# Patient Record
Sex: Female | Born: 1960 | Race: Black or African American | Hispanic: No | Smoking: Current every day smoker
Health system: Southern US, Community
[De-identification: ages and names within clinical notes are randomized; demographics above are authoritative.]

## PROBLEM LIST (undated history)

## (undated) DIAGNOSIS — I2510 Atherosclerotic heart disease of native coronary artery without angina pectoris: Secondary | ICD-10-CM

## (undated) DIAGNOSIS — E079 Disorder of thyroid, unspecified: Secondary | ICD-10-CM

## (undated) DIAGNOSIS — I10 Essential (primary) hypertension: Secondary | ICD-10-CM

## (undated) DIAGNOSIS — E7800 Pure hypercholesterolemia, unspecified: Secondary | ICD-10-CM

## (undated) DIAGNOSIS — E119 Type 2 diabetes mellitus without complications: Secondary | ICD-10-CM

## (undated) HISTORY — PX: CORONARY ANGIOPLASTY: SHX604

## (undated) HISTORY — PX: ABDOMINAL HYSTERECTOMY: SHX81

## (undated) HISTORY — PX: CARDIAC CATHETERIZATION: SHX172

---

## 2016-01-10 ENCOUNTER — Encounter (HOSPITAL_COMMUNITY): Payer: Self-pay

## 2016-01-10 ENCOUNTER — Observation Stay (HOSPITAL_COMMUNITY)
Admission: EM | Admit: 2016-01-10 | Discharge: 2016-01-13 | Disposition: A | Discharge status: Home / Self Care | Payer: BLUE CROSS/BLUE SHIELD | Attending: Internal Medicine | Admitting: Internal Medicine

## 2016-01-10 ENCOUNTER — Emergency Department (HOSPITAL_COMMUNITY): Payer: BLUE CROSS/BLUE SHIELD

## 2016-01-10 DIAGNOSIS — D72829 Elevated white blood cell count, unspecified: Secondary | ICD-10-CM | POA: Insufficient documentation

## 2016-01-10 DIAGNOSIS — I2510 Atherosclerotic heart disease of native coronary artery without angina pectoris: Secondary | ICD-10-CM

## 2016-01-10 DIAGNOSIS — R0789 Other chest pain: Secondary | ICD-10-CM | POA: Diagnosis not present

## 2016-01-10 DIAGNOSIS — Z9641 Presence of insulin pump (external) (internal): Secondary | ICD-10-CM | POA: Diagnosis not present

## 2016-01-10 DIAGNOSIS — E785 Hyperlipidemia, unspecified: Secondary | ICD-10-CM | POA: Diagnosis not present

## 2016-01-10 DIAGNOSIS — E108 Type 1 diabetes mellitus with unspecified complications: Secondary | ICD-10-CM

## 2016-01-10 DIAGNOSIS — E1310 Other specified diabetes mellitus with ketoacidosis without coma: Secondary | ICD-10-CM

## 2016-01-10 DIAGNOSIS — I10 Essential (primary) hypertension: Secondary | ICD-10-CM | POA: Diagnosis present

## 2016-01-10 DIAGNOSIS — E039 Hypothyroidism, unspecified: Secondary | ICD-10-CM | POA: Insufficient documentation

## 2016-01-10 DIAGNOSIS — F17200 Nicotine dependence, unspecified, uncomplicated: Secondary | ICD-10-CM | POA: Diagnosis not present

## 2016-01-10 DIAGNOSIS — E1010 Type 1 diabetes mellitus with ketoacidosis without coma: Principal | ICD-10-CM

## 2016-01-10 DIAGNOSIS — N179 Acute kidney failure, unspecified: Secondary | ICD-10-CM

## 2016-01-10 DIAGNOSIS — Z794 Long term (current) use of insulin: Secondary | ICD-10-CM | POA: Insufficient documentation

## 2016-01-10 DIAGNOSIS — IMO0002 Reserved for concepts with insufficient information to code with codable children: Secondary | ICD-10-CM

## 2016-01-10 DIAGNOSIS — R079 Chest pain, unspecified: Secondary | ICD-10-CM | POA: Diagnosis present

## 2016-01-10 DIAGNOSIS — E1065 Type 1 diabetes mellitus with hyperglycemia: Secondary | ICD-10-CM

## 2016-01-10 DIAGNOSIS — Z885 Allergy status to narcotic agent status: Secondary | ICD-10-CM | POA: Diagnosis not present

## 2016-01-10 DIAGNOSIS — Z955 Presence of coronary angioplasty implant and graft: Secondary | ICD-10-CM | POA: Insufficient documentation

## 2016-01-10 DIAGNOSIS — F329 Major depressive disorder, single episode, unspecified: Secondary | ICD-10-CM | POA: Diagnosis not present

## 2016-01-10 DIAGNOSIS — I259 Chronic ischemic heart disease, unspecified: Secondary | ICD-10-CM

## 2016-01-10 DIAGNOSIS — I081 Rheumatic disorders of both mitral and tricuspid valves: Secondary | ICD-10-CM | POA: Insufficient documentation

## 2016-01-10 DIAGNOSIS — E7800 Pure hypercholesterolemia, unspecified: Secondary | ICD-10-CM | POA: Diagnosis not present

## 2016-01-10 DIAGNOSIS — Z7982 Long term (current) use of aspirin: Secondary | ICD-10-CM | POA: Insufficient documentation

## 2016-01-10 DIAGNOSIS — Z8249 Family history of ischemic heart disease and other diseases of the circulatory system: Secondary | ICD-10-CM | POA: Insufficient documentation

## 2016-01-10 DIAGNOSIS — E1110 Type 2 diabetes mellitus with ketoacidosis without coma: Secondary | ICD-10-CM | POA: Diagnosis present

## 2016-01-10 HISTORY — DX: Essential (primary) hypertension: I10

## 2016-01-10 HISTORY — DX: Atherosclerotic heart disease of native coronary artery without angina pectoris: I25.10

## 2016-01-10 HISTORY — DX: Type 2 diabetes mellitus without complications: E11.9

## 2016-01-10 HISTORY — DX: Pure hypercholesterolemia, unspecified: E78.00

## 2016-01-10 HISTORY — DX: Disorder of thyroid, unspecified: E07.9

## 2016-01-10 LAB — CBC
HEMATOCRIT: 43.6 % (ref 36.0–46.0)
Hemoglobin: 14.9 g/dL (ref 12.0–15.0)
MCH: 35.1 pg — ABNORMAL HIGH (ref 26.0–34.0)
MCHC: 34.2 g/dL (ref 30.0–36.0)
MCV: 102.6 fL — AB (ref 78.0–100.0)
PLATELETS: 158 10*3/uL (ref 150–400)
RBC: 4.25 MIL/uL (ref 3.87–5.11)
RDW: 13 % (ref 11.5–15.5)
WBC: 9.4 10*3/uL (ref 4.0–10.5)

## 2016-01-10 LAB — CBG MONITORING, ED: GLUCOSE-CAPILLARY: 532 mg/dL — AB (ref 65–99)

## 2016-01-10 LAB — I-STAT TROPONIN, ED: Troponin i, poc: 0 ng/mL (ref 0.00–0.08)

## 2016-01-10 MED ORDER — NITROGLYCERIN 0.4 MG SL SUBL
0.4000 mg | SUBLINGUAL_TABLET | SUBLINGUAL | Status: DC | PRN
  Administered 2016-01-11: 0.4 mg via SUBLINGUAL
  Filled 2016-01-10: qty 1

## 2016-01-10 MED ORDER — ASPIRIN 325 MG PO TABS
325.0000 mg | ORAL_TABLET | Freq: Once | ORAL | Status: AC
  Administered 2016-01-10: 325 mg via ORAL
  Filled 2016-01-10: qty 1

## 2016-01-10 MED ORDER — SODIUM CHLORIDE 0.9 % IV BOLUS (SEPSIS)
2000.0000 mL | Freq: Once | INTRAVENOUS | Status: AC
  Administered 2016-01-10: 2000 mL via INTRAVENOUS

## 2016-01-10 NOTE — ED Notes (Signed)
Patient transported to X-ray 

## 2016-01-10 NOTE — ED Provider Notes (Signed)
[facility]-EMERGENCY DEPT Provider Note   CSN: [id number] Arrival date & time: 01/10/16  2304 By signing my name below, I, Katelyn Richard, attest that this documentation has been prepared under the direction and in the presence of Tomasita Crumble, MD. Electronically Signed: Bridgette Richard, ED Scribe. 01/10/16. 11:42 PM.  History   Chief Complaint Chief Complaint  Patient presents with  . Hyperglycemia   The history is provided by the patient. No language interpreter was used.   HPI Comments: Katelyn Richard is a 56 y.o. female with h/o CAD, HTN, and DM on Novolog, who presents to the Emergency Department complaining of generalized weakness onset earlier today with associated nausea, chest pain, and vomiting (x2 episodes). Pt reports it also seems like she has an elevated blood sugar, noting that her CBG has been "undetectable" according to her monitor since this morning. Denies modifying factors. She has not tried any OTC medications PTA. Pt has two stents in place; she takes baby ASA daily. She has recently had a stress test which was unremarkable. Denies any recent illness. Pt further denies fever, shortness of breath, or any other associated symptoms.   Past Medical History:  Diagnosis Date  . Coronary artery disease   . Diabetes mellitus without complication (HCC)   . High cholesterol   . Hypertension   . Thyroid disease     There are no active problems to display for this patient.   History reviewed. No pertinent surgical history.  OB History    No data available       Home Medications    Prior to Admission medications   Not on File    Family History History reviewed. No pertinent family history.  Social History Social History  Substance Use Topics  . Smoking status: Current Every Day Smoker  . Smokeless tobacco: Never Used  . Alcohol use No     Allergies   Patient has no allergy information on record.   Review of Systems Review of Systems 10 Systems reviewed and all are  negative for acute change except as noted in the HPI. Physical Exam Updated Vital Signs BP 138/63 (BP Location: Left Arm)   Pulse 88   Temp 98.1 F (36.7 C) (Oral)   Resp 20   Ht 5\' 5"  (1.651 m)   Wt 142 lb (64.4 kg)   SpO2 100%   BMI 23.63 kg/m   Physical Exam  Constitutional: She is oriented to person, place, and time. She appears well-developed and well-nourished. No distress.  Insulin pump in place.  HENT:  Head: Normocephalic and atraumatic.  Nose: Nose normal.  Mouth/Throat: Oropharynx is clear and moist. No oropharyngeal exudate.  Eyes: Conjunctivae and EOM are normal. Pupils are equal, round, and reactive to light. No scleral icterus.  Neck: Normal range of motion. Neck supple. No JVD present. No tracheal deviation present. No thyromegaly present.  Cardiovascular: Normal rate, regular rhythm and normal heart sounds.  Exam reveals no gallop and no friction rub.   No murmur heard. Pulmonary/Chest: Effort normal and breath sounds normal. No respiratory distress. She has no wheezes. She exhibits no tenderness.  Abdominal: Soft. Bowel sounds are normal. She exhibits no distension and no mass. There is no tenderness. There is no rebound and no guarding.  Musculoskeletal: Normal range of motion. She exhibits no edema or tenderness.  Lymphadenopathy:    She has no cervical adenopathy.  Neurological: She is alert and oriented to person, place, and time. No cranial nerve deficit. She exhibits normal muscle  tone.  Skin: Skin is warm and dry. No rash noted. No erythema. No pallor.  Nursing note and vitals reviewed.    ED Treatments / Results  DIAGNOSTIC STUDIES: Oxygen Saturation is 100% on RA, normal by my interpretation.    COORDINATION OF CARE: 11:40 PM Discussed treatment plan with pt at bedside which includes IV fluids and pt agreed to plan.  Labs (all labs ordered are listed, but only abnormal results are displayed) Labs Reviewed  CBG MONITORING, ED - Abnormal; Notable  for the following:       Result Value   Glucose-Capillary 532 (*)    All other components within normal limits  BASIC METABOLIC PANEL  CBC  URINALYSIS, ROUTINE W REFLEX MICROSCOPIC  CBG MONITORING, ED    EKG  EKG Interpretation None       Radiology No results found.  Procedures Procedures (including critical care time)  Medications Ordered in ED Medications - No data to display   Initial Impression / Assessment and Plan / ED Course  I have reviewed the triage vital signs and the nursing notes.  Pertinent labs & imaging results that were available during my care of the patient were reviewed by me and considered in my medical decision making (see chart for details).  Clinical Course    Patient presents to the ED for chest pain that is concerning for ACS.  In addition, she has hyperglycemia which can be caused by ischemia.  There are no other causes of her high BS by her history.  She has an elevated AG of 17.  She was given 2L IVF and placed on insulin gtt.  Will consult with hospitalist for further care.  No acute ischemia on EKG, and initial troponin is negative.  She was given ASA and nitro.  CRITICAL CARE Performed by: Tomasita CrumbleNI,Friend Dorfman   Total critical care time: 40 minutes - DKA on insulin gtt  Critical care time was exclusive of separately billable procedures and treating other patients.  Critical care was necessary to treat or prevent imminent or life-threatening deterioration.  Critical care was time spent personally by me on the following activities: development of treatment plan with patient and/or surrogate as well as nursing, discussions with consultants, evaluation of patient's response to treatment, examination of patient, obtaining history from patient or surrogate, ordering and performing treatments and interventions, ordering and review of laboratory studies, ordering and review of radiographic studies, pulse oximetry and re-evaluation of patient's condition.       Final Clinical Impressions(s) / ED Diagnoses   Final diagnoses:  None    New Prescriptions New Prescriptions   No medications on file   I personally performed the services described in this documentation, which was scribed in my presence. The recorded information has been reviewed and is accurate.       Tomasita CrumbleAdeleke Alfonse Garringer, MD 01/11/16 [phone]

## 2016-01-10 NOTE — ED Triage Notes (Signed)
Pt is here for a meeting from out of town and her CBG has been undetectable according to her monitor since 10 am, pt is weak, talking slow and unsteady when she walks.

## 2016-01-11 ENCOUNTER — Observation Stay (HOSPITAL_BASED_OUTPATIENT_CLINIC_OR_DEPARTMENT_OTHER): Payer: BLUE CROSS/BLUE SHIELD

## 2016-01-11 ENCOUNTER — Encounter (HOSPITAL_COMMUNITY): Payer: Self-pay | Admitting: Internal Medicine

## 2016-01-11 DIAGNOSIS — E785 Hyperlipidemia, unspecified: Secondary | ICD-10-CM | POA: Diagnosis present

## 2016-01-11 DIAGNOSIS — R079 Chest pain, unspecified: Secondary | ICD-10-CM

## 2016-01-11 DIAGNOSIS — E1110 Type 2 diabetes mellitus with ketoacidosis without coma: Secondary | ICD-10-CM | POA: Diagnosis present

## 2016-01-11 DIAGNOSIS — I10 Essential (primary) hypertension: Secondary | ICD-10-CM | POA: Diagnosis not present

## 2016-01-11 DIAGNOSIS — E1010 Type 1 diabetes mellitus with ketoacidosis without coma: Secondary | ICD-10-CM

## 2016-01-11 DIAGNOSIS — R072 Precordial pain: Secondary | ICD-10-CM

## 2016-01-11 DIAGNOSIS — E1310 Other specified diabetes mellitus with ketoacidosis without coma: Secondary | ICD-10-CM | POA: Diagnosis not present

## 2016-01-11 DIAGNOSIS — N179 Acute kidney failure, unspecified: Secondary | ICD-10-CM

## 2016-01-11 DIAGNOSIS — E784 Other hyperlipidemia: Secondary | ICD-10-CM

## 2016-01-11 LAB — CBC
HCT: 37.7 % (ref 36.0–46.0)
Hemoglobin: 12.8 g/dL (ref 12.0–15.0)
MCH: 35.3 pg — ABNORMAL HIGH (ref 26.0–34.0)
MCHC: 34 g/dL (ref 30.0–36.0)
MCV: 103.9 fL — AB (ref 78.0–100.0)
PLATELETS: 135 10*3/uL — AB (ref 150–400)
RBC: 3.63 MIL/uL — ABNORMAL LOW (ref 3.87–5.11)
RDW: 12.7 % (ref 11.5–15.5)
WBC: 11.8 10*3/uL — AB (ref 4.0–10.5)

## 2016-01-11 LAB — URINALYSIS, ROUTINE W REFLEX MICROSCOPIC
BILIRUBIN URINE: NEGATIVE
Bacteria, UA: NONE SEEN
Hgb urine dipstick: NEGATIVE
KETONES UR: 80 mg/dL — AB
LEUKOCYTES UA: NEGATIVE
Nitrite: NEGATIVE
PH: 5 (ref 5.0–8.0)
PROTEIN: NEGATIVE mg/dL
Specific Gravity, Urine: 1.026 (ref 1.005–1.030)

## 2016-01-11 LAB — BASIC METABOLIC PANEL
ANION GAP: 17 — AB (ref 5–15)
Anion gap: 13 (ref 5–15)
Anion gap: 6 (ref 5–15)
BUN: 28 mg/dL — ABNORMAL HIGH (ref 6–20)
BUN: 26 mg/dL — AB (ref 6–20)
BUN: 21 mg/dL — AB (ref 6–20)
CHLORIDE: 96 mmol/L — AB (ref 101–111)
CO2: 20 mmol/L — AB (ref 22–32)
CO2: 15 mmol/L — ABNORMAL LOW (ref 22–32)
CO2: 24 mmol/L (ref 22–32)
CREATININE: 1.19 mg/dL — AB (ref 0.44–1.00)
CREATININE: 0.94 mg/dL (ref 0.44–1.00)
Calcium: 9.3 mg/dL (ref 8.9–10.3)
Calcium: 8 mg/dL — ABNORMAL LOW (ref 8.9–10.3)
Calcium: 8.1 mg/dL — ABNORMAL LOW (ref 8.9–10.3)
Chloride: 106 mmol/L (ref 101–111)
Chloride: 108 mmol/L (ref 101–111)
Creatinine, Ser: 1.32 mg/dL — ABNORMAL HIGH (ref 0.44–1.00)
GFR calc Af Amer: 51 mL/min — ABNORMAL LOW (ref >60)
GFR calc Af Amer: 58 mL/min — ABNORMAL LOW (ref >60)
GFR calc Af Amer: >60 mL/min (ref >60)
GFR calc non Af Amer: 50 mL/min — ABNORMAL LOW (ref >60)
GFR, EST NON AFRICAN AMERICAN: 44 mL/min — AB (ref >60)
GLUCOSE: 533 mg/dL — AB (ref 65–99)
GLUCOSE: 309 mg/dL — AB (ref 65–99)
GLUCOSE: 130 mg/dL — AB (ref 65–99)
POTASSIUM: 4.7 mmol/L (ref 3.5–5.1)
Potassium: 4.1 mmol/L (ref 3.5–5.1)
Potassium: 3.8 mmol/L (ref 3.5–5.1)
SODIUM: 134 mmol/L — AB (ref 135–145)
SODIUM: 138 mmol/L (ref 135–145)
Sodium: 133 mmol/L — ABNORMAL LOW (ref 135–145)

## 2016-01-11 LAB — TROPONIN I
Troponin I: <0.03 ng/mL (ref <0.03)
Troponin I: <0.03 ng/mL (ref <0.03)

## 2016-01-11 LAB — GLUCOSE, CAPILLARY
GLUCOSE-CAPILLARY: 116 mg/dL — AB (ref 65–99)
GLUCOSE-CAPILLARY: 116 mg/dL — AB (ref 65–99)
GLUCOSE-CAPILLARY: 135 mg/dL — AB (ref 65–99)
GLUCOSE-CAPILLARY: 279 mg/dL — AB (ref 65–99)
GLUCOSE-CAPILLARY: 215 mg/dL — AB (ref 65–99)
Glucose-Capillary: 179 mg/dL — ABNORMAL HIGH (ref 65–99)
Glucose-Capillary: 234 mg/dL — ABNORMAL HIGH (ref 65–99)

## 2016-01-11 LAB — MRSA PCR SCREENING: MRSA by PCR: NEGATIVE

## 2016-01-11 LAB — ECHOCARDIOGRAM COMPLETE
Height: 65 in
WEIGHTICAEL: 2278.67 [oz_av]

## 2016-01-11 LAB — CBG MONITORING, ED
GLUCOSE-CAPILLARY: 265 mg/dL — AB (ref 65–99)
GLUCOSE-CAPILLARY: 219 mg/dL — AB (ref 65–99)
Glucose-Capillary: 402 mg/dL — ABNORMAL HIGH (ref 65–99)
Glucose-Capillary: 475 mg/dL — ABNORMAL HIGH (ref 65–99)
Glucose-Capillary: 268 mg/dL — ABNORMAL HIGH (ref 65–99)
Glucose-Capillary: 158 mg/dL — ABNORMAL HIGH (ref 65–99)
Glucose-Capillary: 135 mg/dL — ABNORMAL HIGH (ref 65–99)

## 2016-01-11 MED ORDER — GABAPENTIN 300 MG PO CAPS
300.0000 mg | ORAL_CAPSULE | Freq: Two times a day (BID) | ORAL | Status: DC
  Administered 2016-01-11 – 2016-01-13 (×5): 300 mg via ORAL
  Filled 2016-01-11 (×5): qty 1

## 2016-01-11 MED ORDER — METOPROLOL TARTRATE 12.5 MG HALF TABLET
12.5000 mg | ORAL_TABLET | Freq: Two times a day (BID) | ORAL | Status: DC
  Administered 2016-01-11 – 2016-01-13 (×5): 12.5 mg via ORAL
  Filled 2016-01-11 (×5): qty 1

## 2016-01-11 MED ORDER — INSULIN ASPART 100 UNIT/ML ~~LOC~~ SOLN
0.0000 [IU] | Freq: Three times a day (TID) | SUBCUTANEOUS | Status: DC
Start: 2016-01-11 — End: 2016-01-12
  Administered 2016-01-11: 5 [IU] via SUBCUTANEOUS
  Administered 2016-01-12: 3 [IU] via SUBCUTANEOUS
  Administered 2016-01-12: 5 [IU] via SUBCUTANEOUS

## 2016-01-11 MED ORDER — VITAMIN D 1000 UNITS PO TABS
2000.0000 [IU] | ORAL_TABLET | Freq: Every day | ORAL | Status: DC
  Administered 2016-01-11 – 2016-01-13 (×3): 2000 [IU] via ORAL
  Filled 2016-01-11 (×4): qty 2

## 2016-01-11 MED ORDER — SODIUM CHLORIDE 0.9 % IV SOLN: INTRAVENOUS | Status: DC

## 2016-01-11 MED ORDER — SODIUM CHLORIDE 0.9 % IV SOLN
INTRAVENOUS | Status: DC
  Administered 2016-01-11: 3.4 [IU]/h via INTRAVENOUS
  Filled 2016-01-11: qty 2.5

## 2016-01-11 MED ORDER — ENOXAPARIN SODIUM 40 MG/0.4ML ~~LOC~~ SOLN
40.0000 mg | SUBCUTANEOUS | Status: DC
  Administered 2016-01-11 – 2016-01-13 (×3): 40 mg via SUBCUTANEOUS
  Filled 2016-01-11 (×4): qty 0.4

## 2016-01-11 MED ORDER — INSULIN ASPART 100 UNIT/ML ~~LOC~~ SOLN
4.0000 [IU] | Freq: Three times a day (TID) | SUBCUTANEOUS | Status: DC
  Administered 2016-01-11 – 2016-01-12 (×3): 4 [IU] via SUBCUTANEOUS

## 2016-01-11 MED ORDER — DEXTROSE-NACL 5-0.45 % IV SOLN
INTRAVENOUS | Status: DC
Start: 2016-01-11 — End: 2016-01-11
  Administered 2016-01-11: 06:00:00 via INTRAVENOUS

## 2016-01-11 MED ORDER — SODIUM CHLORIDE 0.9 % IV SOLN
INTRAVENOUS | Status: DC
  Administered 2016-01-11: 4.2 [IU]/h via INTRAVENOUS
  Filled 2016-01-11: qty 2.5

## 2016-01-11 MED ORDER — POTASSIUM CHLORIDE IN NACL 20-0.9 MEQ/L-% IV SOLN
INTRAVENOUS | Status: DC
  Administered 2016-01-11 – 2016-01-13 (×6): via INTRAVENOUS
  Filled 2016-01-11 (×8): qty 1000

## 2016-01-11 MED ORDER — LEVOTHYROXINE SODIUM 75 MCG PO TABS
150.0000 ug | ORAL_TABLET | Freq: Every day | ORAL | Status: DC
  Administered 2016-01-11 – 2016-01-13 (×3): 150 ug via ORAL
  Filled 2016-01-11: qty 2
  Filled 2016-01-11: qty 1
  Filled 2016-01-11 (×2): qty 2

## 2016-01-11 MED ORDER — SODIUM CHLORIDE 0.9 % IV SOLN
INTRAVENOUS | Status: DC
  Administered 2016-01-11: 03:00:00 via INTRAVENOUS

## 2016-01-11 MED ORDER — ATORVASTATIN CALCIUM 40 MG PO TABS
80.0000 mg | ORAL_TABLET | Freq: Every day | ORAL | Status: DC
  Administered 2016-01-11 – 2016-01-13 (×3): 80 mg via ORAL
  Filled 2016-01-11: qty 1
  Filled 2016-01-11 (×3): qty 2

## 2016-01-11 MED ORDER — AMLODIPINE BESYLATE 10 MG PO TABS: 10.0000 mg | ORAL_TABLET | Freq: Every day | ORAL | Status: DC

## 2016-01-11 MED ORDER — INSULIN ASPART 100 UNIT/ML ~~LOC~~ SOLN
0.0000 [IU] | Freq: Every day | SUBCUTANEOUS | Status: DC
Start: 2016-01-11 — End: 2016-01-13
  Administered 2016-01-11: 2 [IU] via SUBCUTANEOUS

## 2016-01-11 MED ORDER — BUPROPION HCL ER (SR) 150 MG PO TB12
150.0000 mg | ORAL_TABLET | Freq: Two times a day (BID) | ORAL | Status: DC
  Administered 2016-01-11 – 2016-01-13 (×5): 150 mg via ORAL
  Filled 2016-01-11 (×5): qty 1

## 2016-01-11 MED ORDER — INSULIN GLARGINE 100 UNIT/ML ~~LOC~~ SOLN
18.0000 [IU] | Freq: Every day | SUBCUTANEOUS | Status: DC
  Administered 2016-01-11 – 2016-01-12 (×2): 18 [IU] via SUBCUTANEOUS
  Filled 2016-01-11 (×2): qty 0.18

## 2016-01-11 MED ORDER — DEXTROSE-NACL 5-0.45 % IV SOLN: INTRAVENOUS | Status: DC

## 2016-01-11 NOTE — Progress Notes (Addendum)
PROGRESS NOTE  Katelyn Richard ZOX:[id number] DOB: May 30, 1960 DOA: 01/10/2016 PCP: [facility]  Brief History:  56 year old female with a history of coronary artery disease, diabetes mellitus, hypertension, hyperlipidemia presents with 2-3 day history of generalized weakness, nausea, vomiting, abdominal pain. The patient denies any recent illnesses. She denied any fevers, chills, shortness breath, coughing, sore throat, diarrhea, hematochezia, melena. Workup initially revealed serum glucose 533 with anion gap of 17. The patient was started on IV fluids and IV insulin and treated for DKA.  Assessment/Plan: DKA in type I diabetic --patient started on IV insulin with q 1 hour CBG check and q 4 hour BMPs -pt started on aggressive fluid resuscitation -Electrolytes were monitored and repleted -transitioned Lantus--concerned pump not working properly as pt developed DKA without antecedent illness with pump on -diet was advanced once anion gap closed -HbA1C-- -Urinalysis negative for pyuria -Personally reviewed Chest x-ray-- negative for acute findings  -Type 1 diabetes mellitus -Patient has not followed up with endocrinologist in 6 months -She rotates her insulin pump every 3 days -Basal rate 0.800 units per hour; insulin sensitivity 10:1 -Hemoglobin A1c  Atypical chest pain with hx of CAD -Finished cycle troponins -Troponins negative 2 -Personally reviewed EKG-- negative for concerning ischemic changes -12/19/2015 Myoview--normal with EF 69% -continue ASA  AKI -Baseline creatinine 0.8-0.9 -Presented creatinine 1.32 -Secondary to volume depletion -improved  Hypertension -Holding amlodipine secondary to soft blood pressure -Continue reduced dose metoprolol tartrate  Hyperlipidemia -Continue statin  Depression  -continue Wellbutrin  Hypothyroidism -Continue Synthroid  Leukocytosis -Likely stress demargination -Patient is afebrile and hemodynamically  stable -Monitor clinically -Urinalysis and chest x-ray negative      Disposition Plan:   Home 01/12/16 if stable Family Communication:   No Family at bedside--Total time spent 35 minutes.  Greater than 50% spent face to face counseling and coordinating care.   Consultants:  none  Code Status:  FULL   DVT Prophylaxis:  [place] Lovenox   Procedures: As Listed in Progress Note Above  Antibiotics: None    Subjective Patient denies fevers, chills, headache, chest pain, dyspnea, nausea, vomiting, diarrhea, abdominal pain, dysuria, hematuria, hematochezia, and melena.    Objective: Vitals:   01/11/16 0654 01/11/16 0700 01/11/16 0800 01/11/16 0900  BP:  123/66 (!) 116/46   Pulse:  89    Resp:  25 17 12   Temp: 98.1 F (36.7 C)  98.1 F (36.7 C)   TempSrc: Oral  Oral   SpO2:  98% 98%   Weight:   64.6 kg (142 lb 6.7 oz)   Height:   5\' 5"  (1.651 m)     Intake/Output Summary (Last 24 hours) at 01/11/16 0951 Last data filed at 01/11/16 0900  Gross per 24 hour  Intake          2352.12 ml  Output                0 ml  Net          2352.12 ml   Weight change:  Exam:   General:  Pt is alert, follows commands appropriately, not in acute distress  HEENT: No icterus, No thrush, No neck mass, [place]/AT  Cardiovascular: RRR, S1/S2, no rubs, no gallops  Respiratory: CTA bilaterally, no wheezing, no crackles, no rhonchi  Abdomen: Soft/+BS, non tender, non distended, no guarding  Extremities: No edema, No lymphangitis, No petechiae, No rashes, no synovitis   Data Reviewed: I have personally reviewed following  labs and imaging studies Basic Metabolic Panel:  Recent Labs Lab 01/10/16 2326 01/11/16 0332 01/11/16 0828  NA 133* 134* 138  K 4.7 4.1 3.8  CL 96* 106 108  CO2 20* 15* 24  GLUCOSE 533* 309* 130*  BUN 28* 26* 21*  CREATININE 1.32* 1.19* 0.94  CALCIUM 9.3 8.0* 8.1*   Liver Function Tests: No results for input(s): AST, ALT, ALKPHOS, BILITOT, PROT, ALBUMIN in  the last 168 hours. No results for input(s): LIPASE, AMYLASE in the last 168 hours. No results for input(s): AMMONIA in the last 168 hours. Coagulation Profile: No results for input(s): INR, PROTIME in the last 168 hours. CBC:  Recent Labs Lab 01/10/16 2326 01/11/16 0332  WBC 9.4 11.8*  HGB 14.9 12.8  HCT 43.6 37.7  MCV 102.6* 103.9*  PLT 158 135*   Cardiac Enzymes:  Recent Labs Lab 01/11/16 0332 01/11/16 0828  TROPONINI <0.03 <0.03   BNP: Invalid input(s): POCBNP CBG:  Recent Labs Lab 01/11/16 0451 01/11/16 0548 01/11/16 0633 01/11/16 0736 01/11/16 0857  GLUCAP 265* 219* 158* 135* 116*   HbA1C: No results for input(s): HGBA1C in the last 72 hours. Urine analysis:    Component Value Date/Time   COLORURINE STRAW (A) 01/10/2016 0028   APPEARANCEUR CLEAR 01/10/2016 0028   LABSPEC 1.026 01/10/2016 0028   PHURINE 5.0 01/10/2016 0028   GLUCOSEU >=500 (A) 01/10/2016 0028   HGBUR NEGATIVE 01/10/2016 0028   BILIRUBINUR NEGATIVE 01/10/2016 0028   KETONESUR 80 (A) 01/10/2016 0028   PROTEINUR NEGATIVE 01/10/2016 0028   NITRITE NEGATIVE 01/10/2016 0028   LEUKOCYTESUR NEGATIVE 01/10/2016 0028   Sepsis Labs: @LABRCNTIP (procalcitonin:4,lacticidven:4) )No results found for this or any previous visit (from the past 240 hour(s)).   Scheduled Meds: . atorvastatin  80 mg Oral Daily  . buPROPion  150 mg Oral BID  . cholecalciferol  2,000 Units Oral Daily  . enoxaparin (LOVENOX) injection  40 mg Subcutaneous Q24H  . gabapentin  300 mg Oral BID  . levothyroxine  150 mcg Oral QAC breakfast  . metoprolol tartrate  12.5 mg Oral BID   Continuous Infusions: . sodium chloride Stopped (01/11/16 0547)  . dextrose 5 % and 0.45% NaCl 125 mL/hr at 01/11/16 0613  . insulin (NOVOLIN-R) infusion 3.4 Units/hr (01/11/16 0241)    Procedures/Studies: Dg Chest 2 View  Result Date: 01/10/2016 CLINICAL DATA:  Chest pain. History of hypertension and diabetes. Sudden onset shortness of  breath, weakness, dizziness, and chest pain today. Current smoker. EXAM: CHEST  2 VIEW COMPARISON:  None. FINDINGS: Normal heart size and pulmonary vascularity. No focal airspace disease or consolidation in the lungs. No blunting of costophrenic angles. No pneumothorax. Mediastinal contours appear intact. Coronary artery stents. IMPRESSION: No active cardiopulmonary disease. Electronically Signed   By: Burman Nieves M.D.   On: 01/10/2016 23:58    Loletta Harper, DO  [facility] Pager [phone]  If 7PM-7AM, please contact night-coverage [email or web address] Password [email or web address] 01/11/2016, 9:51 AM   LOS: 0 days

## 2016-01-11 NOTE — H&P (Signed)
History and Physical    Katelyn Richard XBJ:[id number] DOB: [birth year] DOA: 01/10/2016  PCP: [facility]  Patient coming from: Home.  Chief Complaint: Elevated blood sugar and chest pain.  HPI: Katelyn Richard is a 56 y.o. female with history of diabetes mellitus type 1, CAD status post stenting, hypothyroidism, hypertension presents to the ER because of increasing blood sugar over the last 3 days and started developing nausea vomiting followed by chest pain yesterday. Patient is in [place] for family meeting. Patient usually gets her [facility] at [facility]. Patient uses insulin pump and states that she has been recently stressed out. Denies having not missed any of her medications or not compliant with her diet. In the ER patient is found to have elevated blood sugar with anion gap. Patient also has been having chest pain off and on mostly retrosternal increasing on exertion. EKG and cardiac markers and chest x-ray were unremarkable. Patient is being admitted for diabetic ketoacidosis and chest pain. Patient did have at least 2-3 episodes of nausea vomiting prior to coming. Abdomen appears benign.   ED Course: Patient was started on IV fluids and IV insulin infusion. Aspirin for chest pain.  Review of Systems: As per HPI, rest all negative.   Past Medical History:  Diagnosis Date  . Coronary artery disease   . Diabetes mellitus without complication (HCC)   . High cholesterol   . Hypertension   . Thyroid disease     Past Surgical History:  Procedure Laterality Date  . ABDOMINAL HYSTERECTOMY    . CARDIAC CATHETERIZATION    . CORONARY ANGIOPLASTY       reports that she has been smoking.  She has never used smokeless tobacco. She reports that she does not drink alcohol. Her drug history is not on file.  Allergies  Allergen Reactions  . Morphine Anaphylaxis and Swelling    Family History  Problem Relation Age of Onset  . Diabetes Mellitus II Mother     . Hypertension Mother   . Hypertension Father   . CAD Neg Hx     Prior to Admission medications   Medication Sig Start Date End Date Taking? Authorizing Provider  amLODipine (NORVASC) 10 MG tablet Take 10 mg by mouth daily.   Yes Historical Provider, MD  atorvastatin (LIPITOR) 80 MG tablet Take 80 mg by mouth daily.   Yes Historical Provider, MD  buPROPion (WELLBUTRIN SR) 150 MG 12 hr tablet Take 150 mg by mouth 2 (two) times daily.   Yes Historical Provider, MD  cholecalciferol (VITAMIN D) 1000 units tablet Take 2,000 Units by mouth daily.   Yes Historical Provider, MD  diclofenac sodium (VOLTAREN) 1 % GEL Apply 2 g topically 4 (four) times daily.   Yes Historical Provider, MD  gabapentin (NEURONTIN) 100 MG capsule Take 300 mg by mouth 2 (two) times daily.   Yes Historical Provider, MD  levothyroxine (SYNTHROID, LEVOTHROID) 50 MCG tablet Take 150 mcg by mouth daily before breakfast.   Yes Historical Provider, MD  metoprolol tartrate (LOPRESSOR) 25 MG tablet Take 12.5 mg by mouth 2 (two) times daily.   Yes Historical Provider, MD  NOVOLOG 100 UNIT/ML injection 0.8 Units by Continuous infusion (non-IV) route every hour. Insulin pump 10/12/15  Yes Historical Provider, MD    Physical Exam: Vitals:   01/11/16 0115 01/11/16 0130 01/11/16 0145 01/11/16 0200  BP:  130/62  133/57  Pulse: 95 97 100 99  Resp: 16 20 22 20   Temp:  TempSrc:      SpO2: 99% 99% 97% 97%  Weight:      Height:          Constitutional: Moderately built and nourished. Vitals:   01/11/16 0115 01/11/16 0130 01/11/16 0145 01/11/16 0200  BP:  130/62  133/57  Pulse: 95 97 100 99  Resp: 16 20 22 20   Temp:      TempSrc:      SpO2: 99% 99% 97% 97%  Weight:      Height:       Eyes: Anicteric. No pallor. ENMT: No discharge from the ears eyes nose and mouth. Neck: No mass felt. No neck rigidity. Respiratory: No rhonchi or crepitations. Cardiovascular: S1 and S2. No murmurs appreciated. Abdomen: Soft  nontender bowel sounds present. No guarding or rigidity. Musculoskeletal: No edema. No joint effusion. Skin: No rash. Skin appears warm. Neurologic: Alert awake oriented to time place and person. Moves all extremities. Psychiatric: Appears normal. Normal affect.   Labs on Admission: I have personally reviewed following labs and imaging studies  CBC:  Recent Labs Lab 01/10/16 2326  WBC 9.4  HGB 14.9  HCT 43.6  MCV 102.6*  PLT 158   Basic Metabolic Panel:  Recent Labs Lab 01/10/16 2326  NA 133*  K 4.7  CL 96*  CO2 20*  GLUCOSE 533*  BUN 28*  CREATININE 1.32*  CALCIUM 9.3   GFR: Estimated Creatinine Clearance: 42.8 mL/min (by C-G formula based on SCr of 1.32 mg/dL (H)). Liver Function Tests: No results for input(s): AST, ALT, ALKPHOS, BILITOT, PROT, ALBUMIN in the last 168 hours. No results for input(s): LIPASE, AMYLASE in the last 168 hours. No results for input(s): AMMONIA in the last 168 hours. Coagulation Profile: No results for input(s): INR, PROTIME in the last 168 hours. Cardiac Enzymes: No results for input(s): CKTOTAL, CKMB, CKMBINDEX, TROPONINI in the last 168 hours. BNP (last 3 results) No results for input(s): PROBNP in the last 8760 hours. HbA1C: No results for input(s): HGBA1C in the last 72 hours. CBG:  Recent Labs Lab 01/10/16 2314 01/11/16 0136 01/11/16 0223  GLUCAP 532* 475* 402*   Lipid Profile: No results for input(s): CHOL, HDL, LDLCALC, TRIG, CHOLHDL, LDLDIRECT in the last 72 hours. Thyroid Function Tests: No results for input(s): TSH, T4TOTAL, FREET4, T3FREE, THYROIDAB in the last 72 hours. Anemia Panel: No results for input(s): VITAMINB12, FOLATE, FERRITIN, TIBC, IRON, RETICCTPCT in the last 72 hours. Urine analysis:    Component Value Date/Time   COLORURINE STRAW (A) 01/10/2016 0028   APPEARANCEUR CLEAR 01/10/2016 0028   LABSPEC 1.026 01/10/2016 0028   PHURINE 5.0 01/10/2016 0028   GLUCOSEU >=500 (A) 01/10/2016 0028   HGBUR  NEGATIVE 01/10/2016 0028   BILIRUBINUR NEGATIVE 01/10/2016 0028   KETONESUR 80 (A) 01/10/2016 0028   PROTEINUR NEGATIVE 01/10/2016 0028   NITRITE NEGATIVE 01/10/2016 0028   LEUKOCYTESUR NEGATIVE 01/10/2016 0028   Sepsis Labs: @LABRCNTIP (procalcitonin:4,lacticidven:4) )No results found for this or any previous visit (from the past 240 hour(s)).   Radiological Exams on Admission: Dg Chest 2 View  Result Date: 01/10/2016 CLINICAL DATA:  Chest pain. History of hypertension and diabetes. Sudden onset shortness of breath, weakness, dizziness, and chest pain today. Current smoker. EXAM: CHEST  2 VIEW COMPARISON:  None. FINDINGS: Normal heart size and pulmonary vascularity. No focal airspace disease or consolidation in the lungs. No blunting of costophrenic angles. No pneumothorax. Mediastinal contours appear intact. Coronary artery stents. IMPRESSION: No active cardiopulmonary disease. Electronically Signed   By:  Burman Nieves M.D.   On: 01/10/2016 23:58    EKG: Independently reviewed. Normal sinus rhythm.  Assessment/Plan Principal Problem:   Diabetic acidosis without coma (HCC) Active Problems:   Chest pain   Hypertension   HLD (hyperlipidemia)    1. Diabetic ketoacidosis in type 1 diabetes - not sure what was the precipitating cause. Patient has been placed on IV insulin infusion along with fluids. Follow metabolic panel closely. Once anion gap gets corrected place patient on back on her insulin pump on Lantus. 2. Chest pain with history of CAD status post stenting - continue aspirin beta blocker and statin. Patient has had a stress test last month, which was unremarkable. Check 2-D echo. 3. Nausea vomiting - abdomen appears benign. Follow clinically. 4. Hypertension - continue present medications. 5. Hypothyroidism - continue present medications.   DVT prophylaxis: Lovenox. Code Status: Full code.  Family Communication: Discussed with patient.  Disposition Plan: Home.  Consults  called: None.  Admission status: Observation.    Eduard Clos MD [facility] Pager [phone].  If 7PM-7AM, please contact night-coverage [email or web address] Password [email or web address]  01/11/2016, 2:35 AM

## 2016-01-11 NOTE — Progress Notes (Signed)
Pt . transferred to 1523 by wheel chair. Belongings sent with patient. Hand off report given to Peace, Charity fundraiserN.

## 2016-01-11 NOTE — Progress Notes (Signed)
  Echocardiogram 2D Echocardiogram has been performed.  Nolon RodBrown, Tony 01/11/2016, 3:14 PM

## 2016-01-11 NOTE — ED Notes (Signed)
Hospitalist at bedside 

## 2016-01-11 NOTE — ED Notes (Signed)
CBG 268 insulin gtt decreased to 2.1 units per hour pt sleeping VS stable

## 2016-01-11 NOTE — ED Notes (Signed)
Report called to ICU Rn. Patient taken to the room with RN on the monitor.

## 2016-01-11 NOTE — Progress Notes (Signed)
Inpatient Diabetes Program Recommendations  AACE/ADA: New Consensus Statement on Inpatient Glycemic Control (2015)  Target Ranges:  Prepandial:   less than 140 mg/dL      Peak postprandial:   less than 180 mg/dL (1-2 hours)      Critically ill patients:  140 - 180 mg/dL   Lab Results  Component Value Date   GLUCAP 135 (H) 01/11/2016    Review of Glycemic Control  Diabetes history: DM1 Outpatient Diabetes medications: Insulin pump Current orders for Inpatient glycemic control: IV insulin per GlucoStabilizer  56 year old female presents to ED with CP and hyperglycemia. Found to be in DKA. Had insulin pump. Started IV insulin drip and ready for transition. Spoke to pt and instructed her to call pump company to do a check since she came in with DKA. Pt has Lantus and Novolog at home and is willing to transition to SQ insulin rather than pump. Pt to call her endocrinologist regarding her pump, along with the pump company.  Discussed with MD and RN. Pump setting:Basal - 0.8 units/hr = 19.2 units 1:10 CHO ratio  Inpatient Diabetes Program Recommendations:    Lantus 18 units Q24H - give 1-2 hours prior to discontinuation of drip Novolog 4 units tidwc for meal coverage insulin if pt eats > 50% meal Novolog sensitive tidwc and hs.  Will continue to follow while inpatient.  Thank you. Ailene Ardshonda Truett Mcfarlan, RD, LDN, CDE Inpatient Diabetes Coordinator [phone]

## 2016-01-12 DIAGNOSIS — E1310 Other specified diabetes mellitus with ketoacidosis without coma: Secondary | ICD-10-CM

## 2016-01-12 DIAGNOSIS — E1065 Type 1 diabetes mellitus with hyperglycemia: Secondary | ICD-10-CM

## 2016-01-12 DIAGNOSIS — E108 Type 1 diabetes mellitus with unspecified complications: Secondary | ICD-10-CM

## 2016-01-12 DIAGNOSIS — I10 Essential (primary) hypertension: Secondary | ICD-10-CM | POA: Diagnosis not present

## 2016-01-12 DIAGNOSIS — E1010 Type 1 diabetes mellitus with ketoacidosis without coma: Secondary | ICD-10-CM | POA: Diagnosis not present

## 2016-01-12 DIAGNOSIS — I2510 Atherosclerotic heart disease of native coronary artery without angina pectoris: Secondary | ICD-10-CM | POA: Diagnosis not present

## 2016-01-12 LAB — CBC
HEMATOCRIT: 34.7 % — AB (ref 36.0–46.0)
HEMOGLOBIN: 11.6 g/dL — AB (ref 12.0–15.0)
MCH: 34.5 pg — ABNORMAL HIGH (ref 26.0–34.0)
MCHC: 33.4 g/dL (ref 30.0–36.0)
MCV: 103.3 fL — ABNORMAL HIGH (ref 78.0–100.0)
Platelets: 114 10*3/uL — ABNORMAL LOW (ref 150–400)
RBC: 3.36 MIL/uL — ABNORMAL LOW (ref 3.87–5.11)
RDW: 13.3 % (ref 11.5–15.5)
WBC: 4.1 10*3/uL (ref 4.0–10.5)

## 2016-01-12 LAB — BASIC METABOLIC PANEL
ANION GAP: 4 — AB (ref 5–15)
BUN: 20 mg/dL (ref 6–20)
CHLORIDE: 111 mmol/L (ref 101–111)
CO2: 23 mmol/L (ref 22–32)
Calcium: 7.8 mg/dL — ABNORMAL LOW (ref 8.9–10.3)
Creatinine, Ser: 1.06 mg/dL — ABNORMAL HIGH (ref 0.44–1.00)
GFR calc Af Amer: >60 mL/min (ref >60)
GFR, EST NON AFRICAN AMERICAN: 58 mL/min — AB (ref >60)
GLUCOSE: 251 mg/dL — AB (ref 65–99)
POTASSIUM: 4.5 mmol/L (ref 3.5–5.1)
Sodium: 138 mmol/L (ref 135–145)

## 2016-01-12 LAB — LIPID PANEL
CHOLESTEROL: 177 mg/dL (ref 0–200)
HDL: 73 mg/dL (ref >40)
LDL CALC: 87 mg/dL (ref 0–99)
Total CHOL/HDL Ratio: 2.4 RATIO
Triglycerides: 84 mg/dL (ref <150)
VLDL: 17 mg/dL (ref 0–40)

## 2016-01-12 LAB — GLUCOSE, CAPILLARY
GLUCOSE-CAPILLARY: 211 mg/dL — AB (ref 65–99)
GLUCOSE-CAPILLARY: 252 mg/dL — AB (ref 65–99)
GLUCOSE-CAPILLARY: 153 mg/dL — AB (ref 65–99)
Glucose-Capillary: 255 mg/dL — ABNORMAL HIGH (ref 65–99)

## 2016-01-12 LAB — HEMOGLOBIN A1C
HEMOGLOBIN A1C: 9 % — AB (ref 4.8–5.6)
MEAN PLASMA GLUCOSE: 212 mg/dL

## 2016-01-12 MED ORDER — INSULIN GLARGINE 100 UNIT/ML ~~LOC~~ SOLN
22.0000 [IU] | Freq: Every day | SUBCUTANEOUS | Status: DC
  Administered 2016-01-13: 22 [IU] via SUBCUTANEOUS
  Filled 2016-01-12: qty 0.22

## 2016-01-12 MED ORDER — INSULIN ASPART 100 UNIT/ML ~~LOC~~ SOLN
0.0000 [IU] | SUBCUTANEOUS | Status: DC
  Administered 2016-01-12: 5 [IU] via SUBCUTANEOUS
  Administered 2016-01-12: 8 [IU] via SUBCUTANEOUS
  Administered 2016-01-13: 3 [IU] via SUBCUTANEOUS

## 2016-01-12 MED ORDER — INSULIN ASPART 100 UNIT/ML ~~LOC~~ SOLN
6.0000 [IU] | Freq: Three times a day (TID) | SUBCUTANEOUS | Status: DC
  Administered 2016-01-12 – 2016-01-13 (×3): 6 [IU] via SUBCUTANEOUS

## 2016-01-12 NOTE — Progress Notes (Signed)
Inpatient Diabetes Program Recommendations  AACE/ADA: New Consensus Statement on Inpatient Glycemic Control (2015)  Target Ranges:  Prepandial:   less than 140 mg/dL      Peak postprandial:   less than 180 mg/dL (1-2 hours)      Critically ill patients:  140 - 180 mg/dL   Lab Results  Component Value Date   GLUCAP 255 (H) 01/12/2016   HGBA1C 9.0 (H) 01/11/2016    Review of Glycemic Control  Blood sugars > 180 mg/dL.  Inpatient Diabetes Program Recommendations:    Increase Lantus to 20 units QD Increase Novolog to 6 units tidwc. To start insulin pump when pt sees her endo and has pump checked by pump company.  Continue to follow.  Thank you. Ailene Ardshonda Marcee Jacobs, RD, LDN, CDE Inpatient Diabetes Coordinator [phone]

## 2016-01-12 NOTE — Progress Notes (Addendum)
PROGRESS NOTE    Katelyn Richard  WRU:[id number] DOB: November 30, 1960 DOA: 01/10/2016 PCP: Domenick Bookbinder, MD     Brief Narrative:  56 y.o. BF PMHx DM type 1 uncontrolled with complications (on pump), CAD native artery S/P stenting, Hypothyroidism, HTN, HLD  Presents to the ER because of increasing blood sugar over the last 3 days and started developing nausea vomiting followed by chest pain yesterday. Patient is in [place] for family meeting. Patient usually gets her care at [facility]. Patient uses insulin pump and states that she has been recently stressed out. Denies having not missed any of her medications or not compliant with her diet. In the ER patient is found to have elevated blood sugar with anion gap. Patient also has been having chest pain off and on mostly retrosternal increasing on exertion. EKG and cardiac markers and chest x-ray were unremarkable. Patient is being admitted for diabetic ketoacidosis and chest pain. Patient did have at least 2-3 episodes of nausea vomiting prior to coming. Abdomen appears benign.    Subjective: 1/11 A/O 4, NAD. [facility] however secondary to problems with obtaining follow-up appointments as obtain new neurologist Dr. Glean Hess Endocrinology [facility] endocrinology [phone]   Assessment & Plan:   Principal Problem:   Diabetic acidosis without coma (HCC) Active Problems:   Chest pain   Hypertension   HLD (hyperlipidemia)   AKI (acute kidney injury) (HCC)   DKA, type 1 (HCC)   Diabetic ketoacidosis in type 1 diabetes/DM type I uncontrolled with complication -1/10 Hemoglobin A1c= 9 -DKA resolved -Counseled patient will be off pump until sees endocrinologist and pump to be replaced/evaluated by her Endocrinologist -Lantus 22 units daily -NovoLog 6 units QAC -Moderate SSI -Lipid panel pending -Schedule follow-up with Dr. Glean Hess for Monday 15 January patient with  malfunctioning insulin pump needs immediate replacement  CAD native artery -CP resolved -Echocardiogram nondiagnostic -Continue aspirin - Hypothyroidism -Synthroid 150 g daily    DVT prophylaxis: Lovenox Code Status: Full Family Communication: None Disposition Plan: Discharge 1/12   Consultants:  None  Procedures/Significant Events:  1/10 Echocardiogram- LVEF=65% to 70%. - Left ventricular diastolic function  parameters were normal. -    VENTILATOR SETTINGS:    Cultures   Antimicrobials:    Devices    LINES / TUBES:      Continuous Infusions: . 0.9 % NaCl with KCl 20 mEq / L 125 mL/hr at 01/12/16 1248     Objective: Vitals:   01/12/16 0235 01/12/16 0627 01/12/16 0957 01/12/16 1049  BP: (!) 135/57 117/73 (!) 165/77 131/71  Pulse: (!) 55 (!) 56 66 (!) 52  Resp: 17 16  15   Temp: 97.6 F (36.4 C) 98 F (36.7 C)  97.8 F (36.6 C)  TempSrc: Oral Other (Comment)  Oral  SpO2: 100% 100%  100%  Weight:      Height:        Intake/Output Summary (Last 24 hours) at 01/12/16 1408 Last data filed at 01/12/16 1050  Gross per 24 hour  Intake           2837.5 ml  Output              800 ml  Net           2037.5 ml   Filed Weights   01/10/16 2315 01/11/16 0800  Weight: 64.4 kg (142 lb) 64.6 kg (142 lb 6.7 oz)    Examination:  General: A/O 4, NAD, No  acute respiratory distress Eyes: negative scleral hemorrhage, negative anisocoria, negative icterus ENT: Negative Runny nose, negative gingival bleeding, Neck:  Negative scars, masses, torticollis, lymphadenopathy, JVD Lungs: Clear to auscultation bilaterally without wheezes or crackles Cardiovascular: Regular rate and rhythm without murmur gallop or rub normal S1 and S2 Abdomen: negative abdominal pain, nondistended, positive soft, bowel sounds, no rebound, no ascites, no appreciable mass Extremities: No significant cyanosis, clubbing, or edema bilateral lower extremities Skin: Negative rashes,  lesions, ulcers Psychiatric:  Negative depression, negative anxiety, negative fatigue, negative mania  Central nervous system:  Cranial nerves II through XII intact, tongue/uvula midline, all extremities muscle strength 5/5, sensation intact throughout, negative dysarthria, negative expressive aphasia, negative receptive aphasia.  .     Data Reviewed: Care during the described time interval was provided by me .  I have reviewed this patient's available data, including medical history, events of note, physical examination, and all test results as part of my evaluation. I have personally reviewed and interpreted all radiology studies.  CBC:  Recent Labs Lab 01/10/16 2326 01/11/16 0332 01/12/16 0448  WBC 9.4 11.8* 4.1  HGB 14.9 12.8 11.6*  HCT 43.6 37.7 34.7*  MCV 102.6* 103.9* 103.3*  PLT 158 135* 114*   Basic Metabolic Panel:  Recent Labs Lab 01/10/16 2326 01/11/16 0332 01/11/16 0828 01/12/16 0448  NA 133* 134* 138 138  K 4.7 4.1 3.8 4.5  CL 96* 106 108 111  CO2 20* 15* 24 23  GLUCOSE 533* 309* 130* 251*  BUN 28* 26* 21* 20  CREATININE 1.32* 1.19* 0.94 1.06*  CALCIUM 9.3 8.0* 8.1* 7.8*   GFR: Estimated Creatinine Clearance: 53.3 mL/min (by C-G formula based on SCr of 1.06 mg/dL (H)). Liver Function Tests: No results for input(s): AST, ALT, ALKPHOS, BILITOT, PROT, ALBUMIN in the last 168 hours. No results for input(s): LIPASE, AMYLASE in the last 168 hours. No results for input(s): AMMONIA in the last 168 hours. Coagulation Profile: No results for input(s): INR, PROTIME in the last 168 hours. Cardiac Enzymes:  Recent Labs Lab 01/11/16 0332 01/11/16 0828 01/11/16 1419  TROPONINI <0.03 <0.03 <0.03   BNP (last 3 results) No results for input(s): PROBNP in the last 8760 hours. HbA1C:  Recent Labs  01/11/16 0331  HGBA1C 9.0*   CBG:  Recent Labs Lab 01/11/16 1308 01/11/16 1640 01/11/16 2321 01/12/16 0735 01/12/16 1216  GLUCAP 234* 279* 215* 255* 211*    Lipid Profile: No results for input(s): CHOL, HDL, LDLCALC, TRIG, CHOLHDL, LDLDIRECT in the last 72 hours. Thyroid Function Tests: No results for input(s): TSH, T4TOTAL, FREET4, T3FREE, THYROIDAB in the last 72 hours. Anemia Panel: No results for input(s): VITAMINB12, FOLATE, FERRITIN, TIBC, IRON, RETICCTPCT in the last 72 hours. Sepsis Labs: No results for input(s): PROCALCITON, LATICACIDVEN in the last 168 hours.  Recent Results (from the past 240 hour(s))  MRSA PCR Screening     Status: None   Collection Time: 01/11/16  9:42 AM  Result Value Ref Range Status   MRSA by PCR NEGATIVE NEGATIVE Final    Comment:        The GeneXpert MRSA Assay (FDA approved for NASAL specimens only), is one component of a comprehensive MRSA colonization surveillance program. It is not intended to diagnose MRSA infection nor to guide or monitor treatment for MRSA infections.          Radiology Studies: Dg Chest 2 View  Result Date: 01/10/2016 CLINICAL DATA:  Chest pain. History of hypertension and diabetes. Sudden onset shortness of  breath, weakness, dizziness, and chest pain today. Current smoker. EXAM: CHEST  2 VIEW COMPARISON:  None. FINDINGS: Normal heart size and pulmonary vascularity. No focal airspace disease or consolidation in the lungs. No blunting of costophrenic angles. No pneumothorax. Mediastinal contours appear intact. Coronary artery stents. IMPRESSION: No active cardiopulmonary disease. Electronically Signed   By: Burman Nieves M.D.   On: 01/10/2016 23:58        Scheduled Meds: . atorvastatin  80 mg Oral Daily  . buPROPion  150 mg Oral BID  . cholecalciferol  2,000 Units Oral Daily  . enoxaparin (LOVENOX) injection  40 mg Subcutaneous Q24H  . gabapentin  300 mg Oral BID  . insulin aspart  0-5 Units Subcutaneous QHS  . insulin aspart  0-9 Units Subcutaneous TID WC  . insulin aspart  4 Units Subcutaneous TID WC  . insulin glargine  18 Units Subcutaneous Daily  .  levothyroxine  150 mcg Oral QAC breakfast  . metoprolol tartrate  12.5 mg Oral BID   Continuous Infusions: . 0.9 % NaCl with KCl 20 mEq / L 125 mL/hr at 01/12/16 1248     LOS: 0 days    Time spent:40 min    Jaice Lague, Roselind Messier, MD [facility] Pager [phone]  If 7PM-7AM, please contact night-coverage [email or web address] Password [facility] 01/12/2016, 2:08 PM

## 2016-01-12 NOTE — Progress Notes (Signed)
Pt was transferred to 1523, pt is AX4, vital signs are stable, will continue to monitor---Victoria Henshaw, rn

## 2016-01-13 DIAGNOSIS — E1310 Other specified diabetes mellitus with ketoacidosis without coma: Secondary | ICD-10-CM | POA: Diagnosis not present

## 2016-01-13 DIAGNOSIS — E1065 Type 1 diabetes mellitus with hyperglycemia: Secondary | ICD-10-CM

## 2016-01-13 DIAGNOSIS — I2510 Atherosclerotic heart disease of native coronary artery without angina pectoris: Secondary | ICD-10-CM | POA: Diagnosis not present

## 2016-01-13 DIAGNOSIS — IMO0002 Reserved for concepts with insufficient information to code with codable children: Secondary | ICD-10-CM

## 2016-01-13 DIAGNOSIS — E108 Type 1 diabetes mellitus with unspecified complications: Secondary | ICD-10-CM

## 2016-01-13 DIAGNOSIS — I10 Essential (primary) hypertension: Secondary | ICD-10-CM | POA: Diagnosis not present

## 2016-01-13 DIAGNOSIS — E1010 Type 1 diabetes mellitus with ketoacidosis without coma: Secondary | ICD-10-CM | POA: Diagnosis not present

## 2016-01-13 LAB — GLUCOSE, CAPILLARY
GLUCOSE-CAPILLARY: 66 mg/dL (ref 65–99)
GLUCOSE-CAPILLARY: 185 mg/dL — AB (ref 65–99)
GLUCOSE-CAPILLARY: 236 mg/dL — AB (ref 65–99)
GLUCOSE-CAPILLARY: 206 mg/dL — AB (ref 65–99)
Glucose-Capillary: 143 mg/dL — ABNORMAL HIGH (ref 65–99)
Glucose-Capillary: 148 mg/dL — ABNORMAL HIGH (ref 65–99)

## 2016-01-13 NOTE — Progress Notes (Signed)
Inpatient Diabetes Program Recommendations  AACE/ADA: New Consensus Statement on Inpatient Glycemic Control (2015)  Target Ranges:  Prepandial:   less than 140 mg/dL      Peak postprandial:   less than 180 mg/dL (1-2 hours)      Critically ill patients:  140 - 180 mg/dL   Lab Results  Component Value Date   GLUCAP 236 (H) 01/13/2016   HGBA1C 9.0 (H) 01/11/2016   LATE ENTRY FROM 01/12/2016 AT 1840  Review of Glycemic Control  Spoke with pt at length regarding her HgbA1C of 9.0% and ? whether insulin pump was malfunctioning prior to admission or was insertion site kinked. Pt called pump company and they walked her through check to see whether pump was working. They found no errors or problems with pump and from their standpoint, feel it is safe to restart. Therefore, this Clinical research associatewriter believes problem was insertion site, which may have been kinked or needle could have been bent.  Pt does not have needle for insertion site available at present. She will go home after discharge and pt has supplies and insulin at home.  Received Lantus yesterday at 1210 on 1/11 (2 hours late d/t being transferred to floor) Also received 26 units of Novolog as correction insulin.  Instructed pt to use Novolog correction and meal coverage when discharged from hospital and restart insulin pump on 01/13/2016 at 9-10 am. Would also recommend decreasing the Lantus dose this morning to Lantus 20 units.  Pt instructed to f/u with endo within a week of discharge. Check blood sugars frequently (at least 4x/day) and take logbook to endo. Also instructed pt to call endo if blood sugars are steadily going up over 300 mg/dL and ask if she should give extra boluses until blood sugars decrease.  Thank you. Ailene Ardshonda Jennifer Payes, RD, LDN, CDE Inpatient Diabetes Coordinator [phone]

## 2016-01-13 NOTE — Discharge Summary (Signed)
Physician Discharge Summary  Katelyn Richard WUJ:[id number] DOB: 1960-07-14 DOA: 01/10/2016  PCP: Katelyn BookbinderARTER, MAYA JAMILAH, MD  Admit date: 01/10/2016 Discharge date: 01/13/2016  Time spent: 35 minutes  Recommendations for Outpatient Follow-up:   Diabetic ketoacidosis in type 1 diabetes/DM type I uncontrolled with complication -1/10 Hemoglobin A1c= 9 -DKA resolved -Counseled patient, that pump is not malfunctioning and therefore problem is not mechanical in nature. Reviewed with patient American diabetic Association guidelines for nutrition and hemoglobin A1c goals.  -Lantus 20 units daily: Discontinue on 1/13 and restart home pump regimen -NovoLog 6 units QAC discontinue on 1/13 and restart home pump regimen -Lipid panel pending -Schedule follow-up with Dr. Glean HessJenna Richard ASAP, DKA/DM type I uncontrolled with complication.   Essential HTN -Continue Metoprolol BID -Schedule follow-up with KatelynJamilah Montez Moritaarter 1-2 weeks following discharge for uncontrolled diabetes, hypertension, HLD  CAD native artery -CP resolved -Echocardiogram nondiagnostic -Continue aspirin  Hypothyroidism -Synthroid 150 g daily  HLD -Lipitor daily   Discharge Diagnoses:  Principal Problem:   Diabetic acidosis without coma (HCC) Active Problems:   Chest pain   Hypertension   HLD (hyperlipidemia)   AKI (acute kidney injury) (HCC)   DKA, type 1 (HCC)   CAD in native artery   Diabetes mellitus type 1, uncontrolled, with complications [facility](HCC)   Discharge Condition: Stable  Diet recommendation: American diabetic Association  Filed Weights   01/10/16 2315 01/11/16 0800  Weight: 64.4 kg (142 lb) 64.6 kg (142 lb 6.7 oz)    History of present illness:  56 y.o.Katelyn Richard PMHx DM type 1 uncontrolled with complications (on pump), CAD native artery S/P stenting, Hypothyroidism, HTN, HLD  Presents to the ER because of increasing blood sugar over the last 3 days and started developing nausea vomiting followed by chest  pain yesterday. [place]. [facility] Patient uses insulin pump and states that she has been recently stressed out. Denies having not missed any of her medications or not compliant with her diet. In the ER patient is found to have elevated blood sugar with anion gap. Patient also has been having chest pain off and on mostly retrosternal increasingon exertion. EKG and cardiac markers and chest x-ray were unremarkable. Patient is being admitted for diabetic ketoacidosis and chest pain. Patient did have at least 2-3 episodes of nausea vomiting prior to coming. Abdomen appears benign. During his hospitalization patient was treated for DKA, diabetes type 1 uncontrolled with complication using glucose stabilizer protocol. In addition patient's insulin pump was checked to ensure that it was not malfunctioning. Patient's pump was found to be functioning correctly, and patient was reassured that the problem was not mechanical in nature.     Consultants:  None  Procedures/Significant Events:  1/10 Echocardiogram- LVEF=65%to 70%. - Left ventricular diastolic function parameters were normal.     Discharge Exam: Vitals:   01/12/16 2012 01/13/16 0104 01/13/16 0437 01/13/16 1052  BP: 138/74 117/71 120/65 (!) 161/82  Pulse: (!) 52 (!) 54 60 (!) 53  Resp: 16 15 16 16   Temp: 97.8 F (36.6 C) 98 F (36.7 C) 98.6 F (37 C) 98.7 F (37.1 C)  TempSrc: Oral Oral Oral Oral  SpO2: 100% 100% 100% 100%  Weight:      Height:        General: A/O 4, NAD, No acute respiratory distress Eyes: negative scleral hemorrhage, negative anisocoria, negative icterus ENT: Negative Runny nose, negative gingival bleeding, Neck:  Negative scars, masses, torticollis, lymphadenopathy,  JVD Lungs: Clear to auscultation bilaterally without wheezes or crackles Cardiovascular: Regular rate and rhythm without murmur gallop or rub normal S1  and S2 Abdomen: negative abdominal pain, nondistended, positive soft, bowel sounds, no rebound, no ascites, no appreciable mass   Discharge Instructions   Allergies as of 01/13/2016      Reactions   Morphine Anaphylaxis, Swelling      Medication List    STOP taking these medications   amLODipine 10 MG tablet Commonly known as:  NORVASC     TAKE these medications   atorvastatin 80 MG tablet Commonly known as:  LIPITOR Take 80 mg by mouth daily.   buPROPion 150 MG 12 hr tablet Commonly known as:  WELLBUTRIN SR Take 150 mg by mouth 2 (two) times daily.   cholecalciferol 1000 units tablet Commonly known as:  VITAMIN D Take 2,000 Units by mouth daily.   diclofenac sodium 1 % Gel Commonly known as:  VOLTAREN Apply 2 g topically 4 (four) times daily.   gabapentin 100 MG capsule Commonly known as:  NEURONTIN Take 300 mg by mouth 2 (two) times daily.   levothyroxine 50 MCG tablet Commonly known as:  SYNTHROID, LEVOTHROID Take 150 mcg by mouth daily before breakfast.   metoprolol tartrate 25 MG tablet Commonly known as:  LOPRESSOR Take 12.5 mg by mouth 2 (two) times daily.   NOVOLOG 100 UNIT/ML injection Generic drug:  insulin aspart 0.8 Units by Continuous infusion (non-IV) route every hour. Insulin pump      Allergies  Allergen Reactions  . Morphine Anaphylaxis and Swelling   Follow-up Information    Schedule an appointment as soon as possible for a visit with Richard, Katelyn N.   Why:  Schedule follow-up ASAP, DKA/DM type I uncontrolled with complication.  Contact information: [address] MEDICINE [facility] [address] [place] [address] [phone]            The results of significant diagnostics from this hospitalization (including imaging, microbiology, ancillary and laboratory) are listed below for reference.    Significant Diagnostic Studies: Dg Chest 2 View  Result Date: 01/10/2016 CLINICAL DATA:  Chest pain. History of hypertension and diabetes.  Sudden onset shortness of breath, weakness, dizziness, and chest pain today. Current smoker. EXAM: CHEST  2 VIEW COMPARISON:  None. FINDINGS: Normal heart size and pulmonary vascularity. No focal airspace disease or consolidation in the lungs. No blunting of costophrenic angles. No pneumothorax. Mediastinal contours appear intact. Coronary artery stents. IMPRESSION: No active cardiopulmonary disease. Electronically Signed   By: Burman Nieves M.D.   On: 01/10/2016 23:58    Microbiology: Recent Results (from the past 240 hour(s))  MRSA PCR Screening     Status: None   Collection Time: 01/11/16  9:42 AM  Result Value Ref Range Status   MRSA by PCR NEGATIVE NEGATIVE Final    Comment:        The GeneXpert MRSA Assay (FDA approved for NASAL specimens only), is one component of a comprehensive MRSA colonization surveillance program. It is not intended to diagnose MRSA infection nor to guide or monitor treatment for MRSA infections.      Labs: Basic Metabolic Panel:  Recent Labs Lab 01/10/16 2326 01/11/16 0332 01/11/16 0828 01/12/16 0448  NA 133* 134* 138 138  K 4.7 4.1 3.8 4.5  CL 96* 106 108 111  CO2 20* 15* 24 23  GLUCOSE 533* 309* 130* 251*  BUN 28* 26* 21* 20  CREATININE 1.32* 1.19* 0.94 1.06*  CALCIUM 9.3 8.0*  8.1* 7.8*   Liver Function Tests: No results for input(s): AST, ALT, ALKPHOS, BILITOT, PROT, ALBUMIN in the last 168 hours. No results for input(s): LIPASE, AMYLASE in the last 168 hours. No results for input(s): AMMONIA in the last 168 hours. CBC:  Recent Labs Lab 01/10/16 2326 01/11/16 0332 01/12/16 0448  WBC 9.4 11.8* 4.1  HGB 14.9 12.8 11.6*  HCT 43.6 37.7 34.7*  MCV 102.6* 103.9* 103.3*  PLT 158 135* 114*   Cardiac Enzymes:  Recent Labs Lab 01/11/16 0332 01/11/16 0828 01/11/16 1419  TROPONINI <0.03 <0.03 <0.03   BNP: BNP (last 3 results) No results for input(s): BNP in the last 8760 hours.  ProBNP (last 3 results) No results for  input(s): PROBNP in the last 8760 hours.  CBG:  Recent Labs Lab 01/13/16 0102 01/13/16 0417 01/13/16 0724 01/13/16 0854 01/13/16 1157  GLUCAP 66 143* 185* 236* 148*       Signed:  Carolyne Littles, MD [facility] [phone] pager

## 2016-01-13 NOTE — Progress Notes (Signed)
Pt was discharged home today. Instructions were reviewed with patient, and questions were answered. Pt was taken to main entrance via wheelchair by NT.  

## 2018-09-29 IMAGING — CR DG CHEST 2V
2 series · 2 of 2 positions shown · non-contrast
Comparison: None.

CLINICAL DATA: Chest pain. History of hypertension and diabetes.
Sudden onset shortness of breath, weakness, dizziness, and chest
pain today. Current smoker.

EXAM:
CHEST  2 VIEW

[w chest lat]
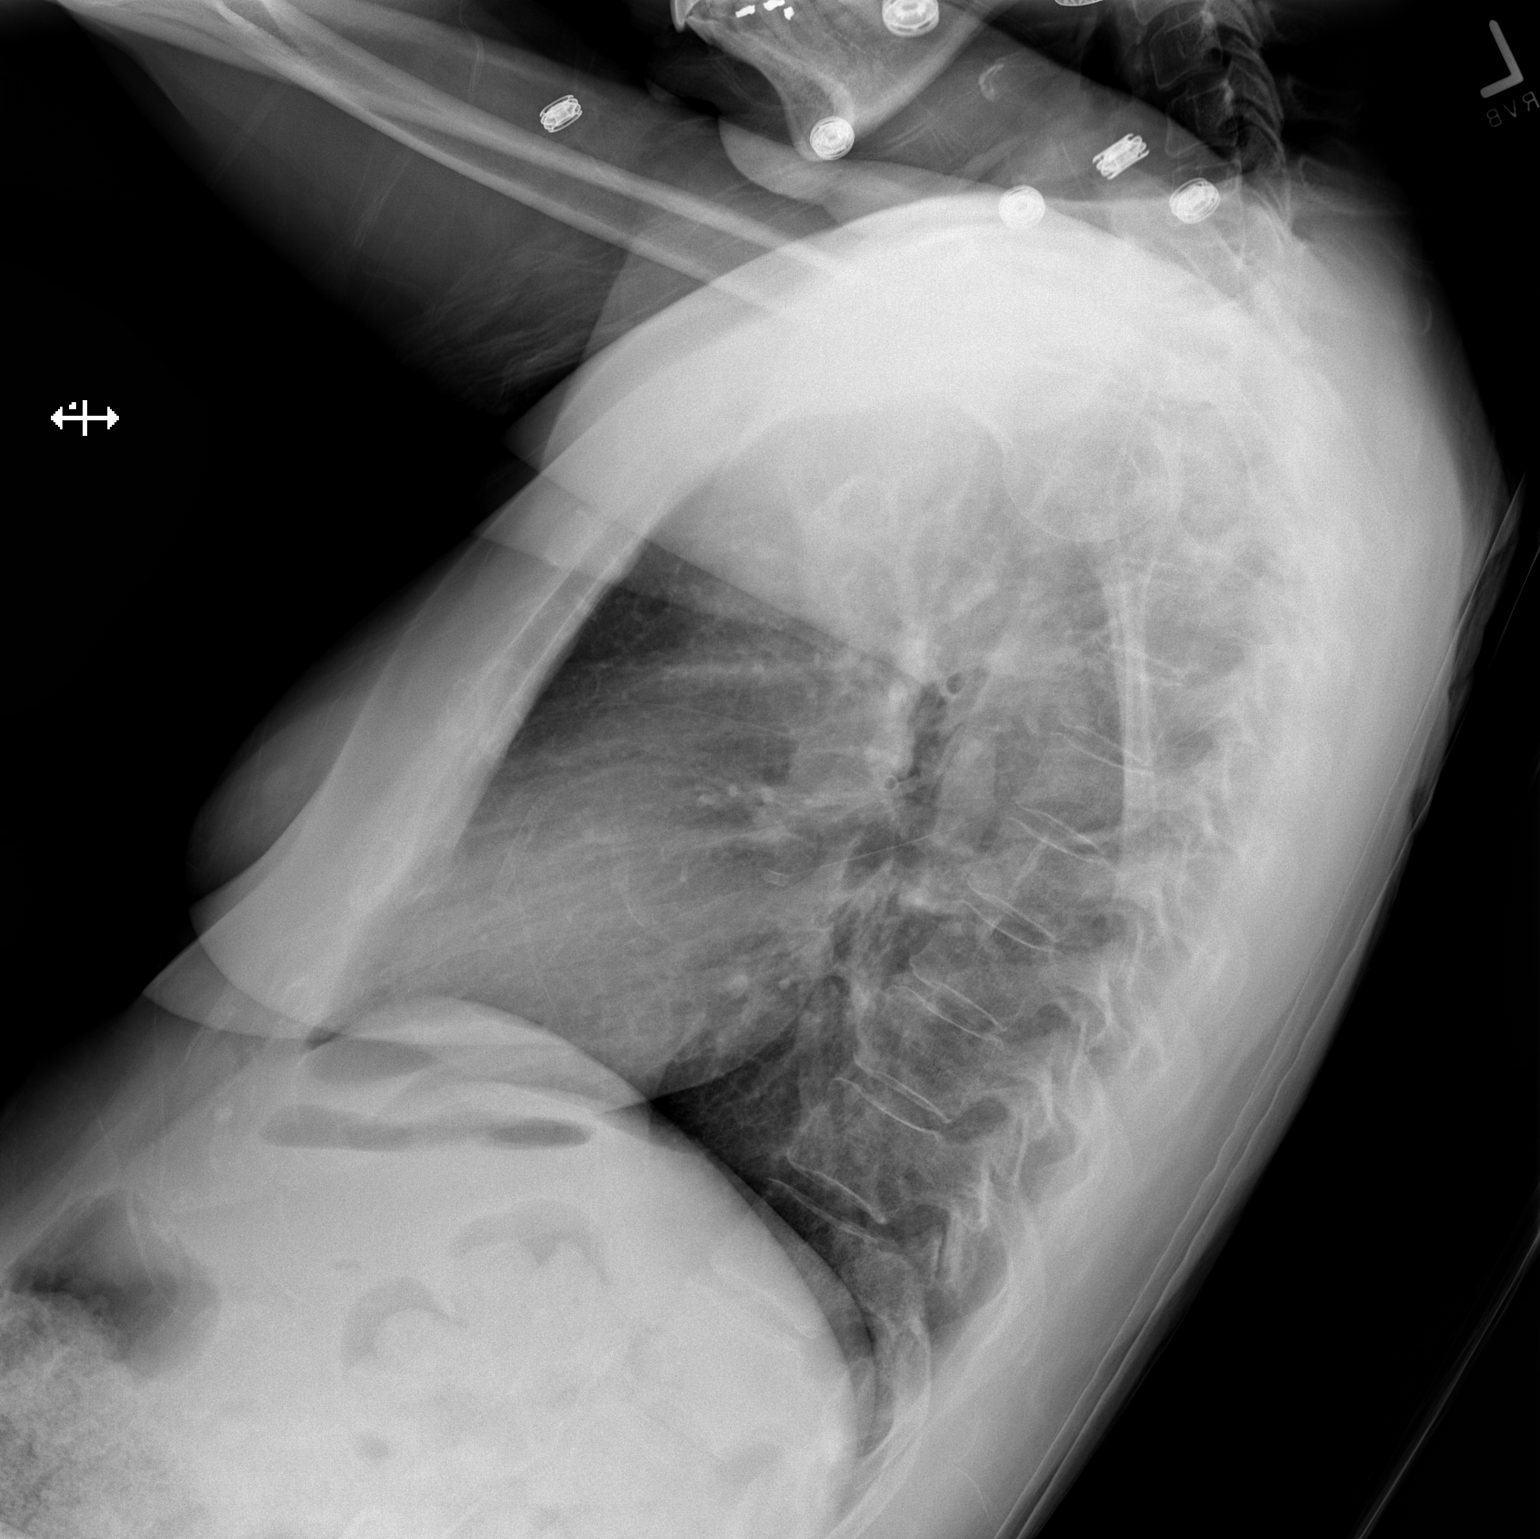

[x chest ap]
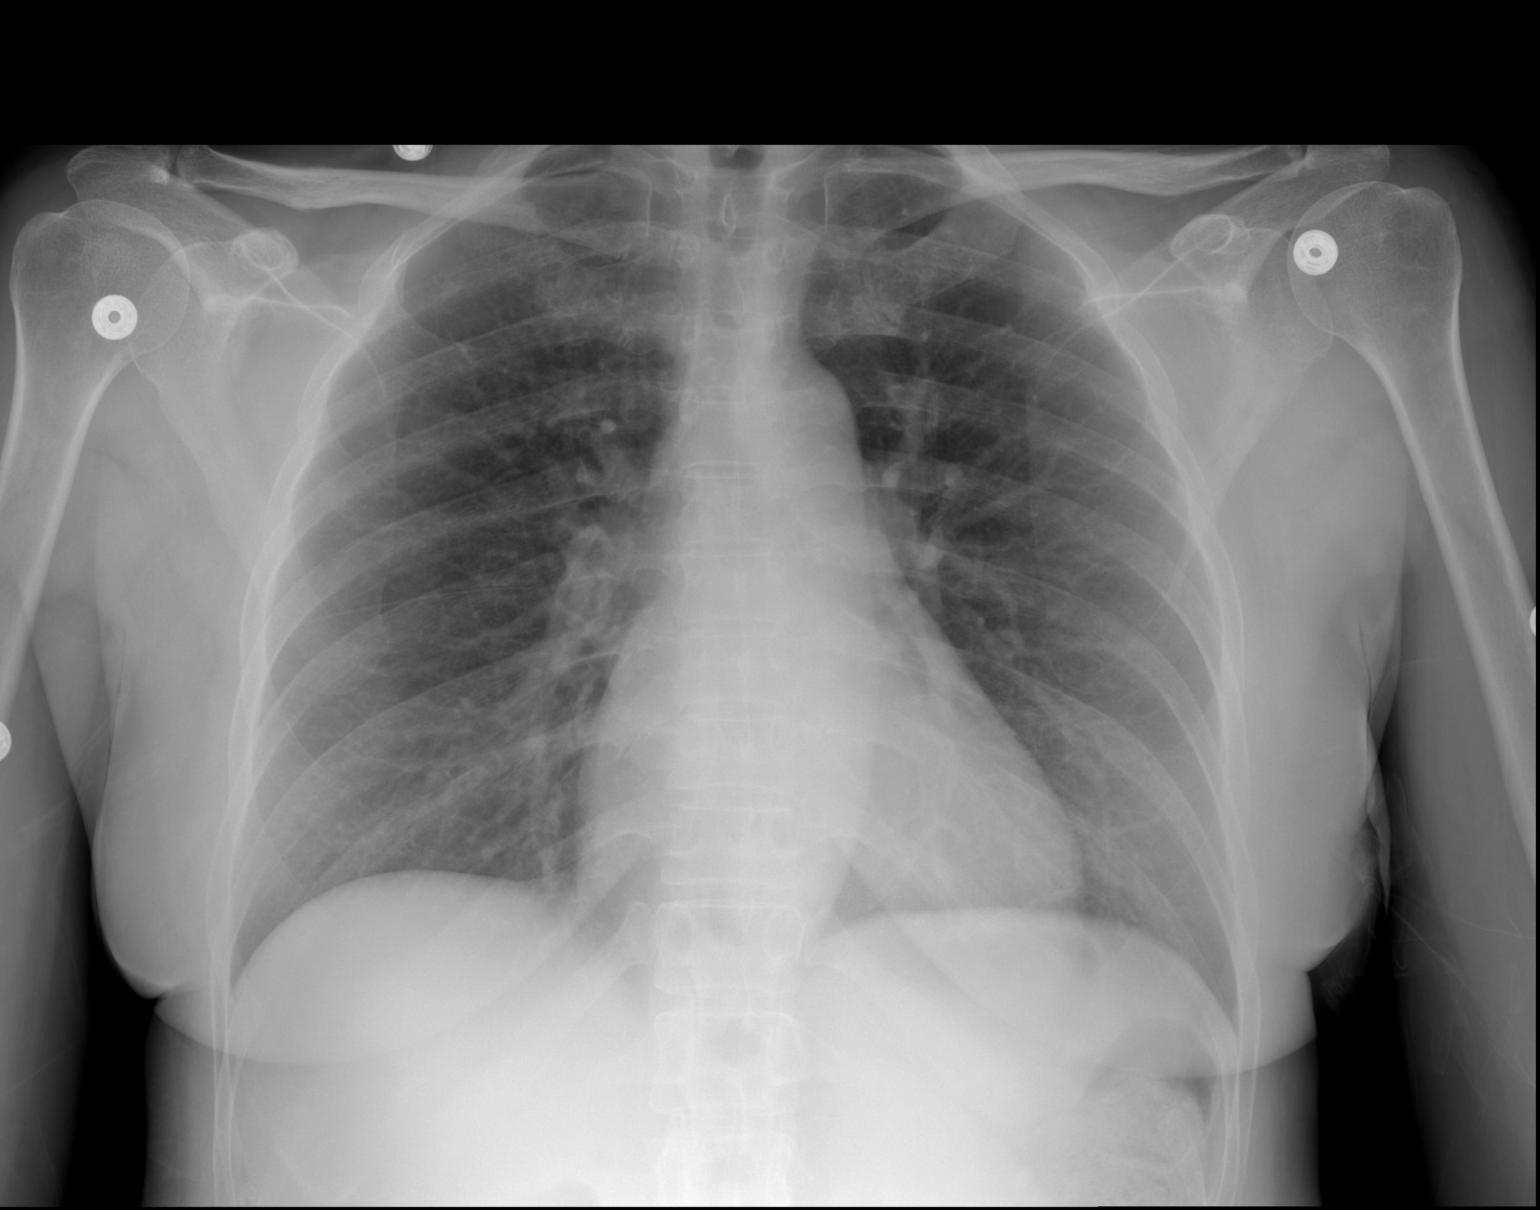

[2 of 2 positions shown; findings below may reference images not displayed]

FINDINGS: Normal heart size and pulmonary vascularity. No focal airspace
disease or consolidation in the lungs. No blunting of costophrenic
angles. No pneumothorax. Mediastinal contours appear intact.
Coronary artery stents.
IMPRESSION: No active cardiopulmonary disease.
